# Patient Record
Sex: Male | Born: 1995 | Hispanic: Yes | Marital: Single
Health system: Southern US, Community
[De-identification: ages and names within clinical notes are randomized; demographics above are authoritative.]

---

## 2021-03-23 ENCOUNTER — Other Ambulatory Visit: Payer: Self-pay

## 2021-03-23 ENCOUNTER — Encounter (HOSPITAL_COMMUNITY): Payer: Self-pay

## 2021-03-23 ENCOUNTER — Emergency Department (HOSPITAL_COMMUNITY)
Admission: EM | Admit: 2021-03-23 | Discharge: 2021-03-24 | Disposition: A | Discharge status: Home / Self Care | Payer: Self-pay | Attending: Emergency Medicine | Admitting: Emergency Medicine

## 2021-03-23 ENCOUNTER — Emergency Department (HOSPITAL_COMMUNITY): Payer: Self-pay

## 2021-03-23 DIAGNOSIS — K529 Noninfective gastroenteritis and colitis, unspecified: Secondary | ICD-10-CM | POA: Insufficient documentation

## 2021-03-23 DIAGNOSIS — M545 Low back pain, unspecified: Secondary | ICD-10-CM | POA: Insufficient documentation

## 2021-03-23 LAB — COMPREHENSIVE METABOLIC PANEL
ALT: 21 U/L (ref 0–44)
AST: 18 U/L (ref 15–41)
Albumin: 4.8 g/dL (ref 3.5–5.0)
Alkaline Phosphatase: 69 U/L (ref 38–126)
Anion gap: 10 (ref 5–15)
BUN: 9 mg/dL (ref 6–20)
CO2: 24 mmol/L (ref 22–32)
Calcium: 9.6 mg/dL (ref 8.9–10.3)
Chloride: 103 mmol/L (ref 98–111)
Creatinine, Ser: 0.84 mg/dL (ref 0.61–1.24)
GFR, Estimated: >60 mL/min (ref >60)
Glucose, Bld: 132 mg/dL — ABNORMAL HIGH (ref 70–99)
Potassium: 4 mmol/L (ref 3.5–5.1)
Sodium: 137 mmol/L (ref 135–145)
Total Bilirubin: 0.6 mg/dL (ref 0.3–1.2)
Total Protein: 7.6 g/dL (ref 6.5–8.1)

## 2021-03-23 LAB — CBC
HCT: 49.3 % (ref 39.0–52.0)
Hemoglobin: 16.8 g/dL (ref 13.0–17.0)
MCH: 29 pg (ref 26.0–34.0)
MCHC: 34.1 g/dL (ref 30.0–36.0)
MCV: 85 fL (ref 80.0–100.0)
Platelets: 214 10*3/uL (ref 150–400)
RBC: 5.8 MIL/uL (ref 4.22–5.81)
RDW: 12.3 % (ref 11.5–15.5)
WBC: 7.2 10*3/uL (ref 4.0–10.5)
nRBC: 0 % (ref 0.0–0.2)

## 2021-03-23 LAB — URINALYSIS, ROUTINE W REFLEX MICROSCOPIC
Bilirubin Urine: NEGATIVE
Glucose, UA: NEGATIVE mg/dL
Hgb urine dipstick: NEGATIVE
Ketones, ur: NEGATIVE mg/dL
Leukocytes,Ua: NEGATIVE
Nitrite: NEGATIVE
Protein, ur: NEGATIVE mg/dL
Specific Gravity, Urine: 1.023 (ref 1.005–1.030)
pH: 5 (ref 5.0–8.0)

## 2021-03-23 LAB — LIPASE, BLOOD: Lipase: 24 U/L (ref 11–51)

## 2021-03-23 MED ORDER — FAMOTIDINE 20 MG PO TABS: 20.0000 mg | ORAL_TABLET | Freq: Two times a day (BID) | ORAL | 0 refills | Status: AC

## 2021-03-23 MED ORDER — ONDANSETRON HCL 4 MG/2ML IJ SOLN
4.0000 mg | Freq: Once | INTRAMUSCULAR | Status: AC
  Administered 2021-03-23: 4 mg via INTRAVENOUS
  Filled 2021-03-23: qty 2

## 2021-03-23 MED ORDER — MORPHINE SULFATE (PF) 4 MG/ML IV SOLN
4.0000 mg | Freq: Once | INTRAVENOUS | Status: AC
  Administered 2021-03-23: 4 mg via INTRAVENOUS
  Filled 2021-03-23: qty 1

## 2021-03-23 MED ORDER — SODIUM CHLORIDE 0.9 % IV BOLUS
1000.0000 mL | Freq: Once | INTRAVENOUS | Status: AC
  Administered 2021-03-23: 1000 mL via INTRAVENOUS

## 2021-03-23 MED ORDER — IOHEXOL 350 MG/ML SOLN
80.0000 mL | Freq: Once | INTRAVENOUS | Status: AC | PRN
  Administered 2021-03-23: 80 mL via INTRAVENOUS

## 2021-03-23 MED ORDER — SODIUM CHLORIDE 0.9 % IV SOLN: INTRAVENOUS | Status: DC

## 2021-03-23 MED ORDER — ONDANSETRON HCL 4 MG PO TABS: 4.0000 mg | ORAL_TABLET | Freq: Four times a day (QID) | ORAL | 0 refills | Status: AC

## 2021-03-23 NOTE — ED Provider Notes (Signed)
[facility]-EMERGENCY DEPT Provider Note   CSN: [id number] Arrival date & time: 03/23/21  2035     History Chief Complaint  Patient presents with   Abdominal Pain   Nausea   Emesis    Darrell Osborne is a 25 y.o. male with no past medical history presenting today with a complaint of abdominal pain and vomiting that began this morning at 1 AM. Patient was evaluated with interpreter.  He states that this began suddenly but he attempted to wait it out. However he continued to vomit.  No blood noted.  Pain does not seem to be worsened by eating.  Reports history of an appendectomy 12 years ago.  He has had no diarrhea or constipation or urinary symptoms.  Mild lower back pain present prior to this illness.    History reviewed. No pertinent past medical history.  There are no problems to display for this patient.   History reviewed. No pertinent surgical history.     History reviewed. No pertinent family history.     Home Medications Prior to Admission medications   Not on File    Allergies    Patient has no known allergies.  Review of Systems   Review of Systems  Constitutional:  Negative for chills and fever.  Respiratory:  Negative for shortness of breath.   Cardiovascular:  Negative for chest pain and palpitations.  Gastrointestinal:  Positive for abdominal pain, nausea and vomiting. Negative for constipation and diarrhea.  Genitourinary:  Negative for difficulty urinating, dysuria and frequency.  Musculoskeletal:  Positive for back pain.  Neurological:  Positive for dizziness. Negative for weakness, light-headedness and headaches.  All other systems reviewed and are negative.  Physical Exam Updated Vital Signs BP (!) 157/90   Pulse 60   Resp 20   SpO2 97%   Physical Exam Vitals and nursing note reviewed.  Constitutional:      General: He is not in acute distress.    Appearance: Normal appearance. He is well-developed.   HENT:     Head: Normocephalic and atraumatic.     Mouth/Throat:     Mouth: Mucous membranes are moist.     Pharynx: Oropharynx is clear.  Eyes:     General: No scleral icterus.    Conjunctiva/sclera: Conjunctivae normal.  Cardiovascular:     Rate and Rhythm: Normal rate and regular rhythm.  Pulmonary:     Effort: Pulmonary effort is normal. No respiratory distress.  Abdominal:     General: Abdomen is flat. Bowel sounds are normal. There is no distension.     Palpations: Abdomen is soft.     Tenderness: There is abdominal tenderness in the epigastric area and periumbilical area. There is no right CVA tenderness or left CVA tenderness.  Skin:    General: Skin is warm and dry.     Findings: No rash.  Neurological:     Mental Status: He is alert.  Psychiatric:        Mood and Affect: Mood normal.    ED Results / Procedures / Treatments   Labs (all labs ordered are listed, but only abnormal results are displayed) Labs Reviewed  COMPREHENSIVE METABOLIC PANEL - Abnormal; Notable for the following components:      Result Value   Glucose, Bld 132 (*)    All other components within normal limits  LIPASE, BLOOD  CBC  URINALYSIS, ROUTINE W REFLEX MICROSCOPIC    EKG None  Radiology CT ABDOMEN PELVIS W CONTRAST  Result  Date: 03/23/2021 CLINICAL DATA:  Epigastric pain with abdominal pain and swelling. EXAM: CT ABDOMEN AND PELVIS WITH CONTRAST TECHNIQUE: Multidetector CT imaging of the abdomen and pelvis was performed using the standard protocol following bolus administration of intravenous contrast. CONTRAST:  3mL OMNIPAQUE IOHEXOL 350 MG/ML SOLN COMPARISON:  None. FINDINGS: Lower chest: No acute abnormality. Hepatobiliary: No focal liver abnormality is seen. No gallstones, gallbladder wall thickening, or biliary dilatation. Pancreas: Unremarkable. No pancreatic ductal dilatation or surrounding inflammatory changes. Spleen: Normal in size without focal abnormality. Adrenals/Urinary  Tract: Adrenal glands are unremarkable. Kidneys are normal, without renal calculi, focal lesion, or hydronephrosis. Bladder is unremarkable. Stomach/Bowel: Stomach is within normal limits. The appendix is poorly visualized and may be retrocecal in location. No secondary signs of appendicitis are identified. No evidence of bowel wall thickening, distention, or inflammatory changes. Vascular/Lymphatic: No significant vascular findings are present. No enlarged abdominal or pelvic lymph nodes. Reproductive: Prostate is unremarkable. Other: No abdominal wall hernia or abnormality. No abdominopelvic ascites. Musculoskeletal: No acute or significant osseous findings. IMPRESSION: No evidence of acute or active process within the abdomen or pelvis. Electronically Signed   By: Aram Candela M.D.   On: 03/23/2021 22:12    Procedures Procedures   Medications Ordered in ED Medications  sodium chloride 0.9 % bolus 1,000 mL (1,000 mLs Intravenous New Bag/Given 03/23/21 2149)    And  0.9 %  sodium chloride infusion (has no administration in time range)  morphine 4 MG/ML injection 4 mg (has no administration in time range)  ondansetron (ZOFRAN) injection 4 mg (4 mg Intravenous Given 03/23/21 2146)  morphine 4 MG/ML injection 4 mg (4 mg Intravenous Given 03/23/21 2146)  iohexol (OMNIPAQUE) 350 MG/ML injection 80 mL (80 mLs Intravenous Contrast Given 03/23/21 2156)    ED Course  I have reviewed the triage vital signs and the nursing notes.  Pertinent labs & imaging results that were available during my care of the patient were reviewed by me and considered in my medical decision making (see chart for details).  Patient was evaluated by me via the interpreter at bedside. HR 90s.   MDM Rules/Calculators/A&P                         Patient presented with acute abdominal pain that began at 1 AM this morning.  He vomited many times and was concerned that he might have an ulcer.  I ordered standard abdominal pain  labs that were unremarkable. CT scan also revealed no abnormalities.  Upon reevaluation patient was in NAD and said morphine helped. Still slightly tender to the epigastrium. He reports feeling better and he and mother comforted by the benign work up. Mother at bedside without questions. I gave the patient ice water and re-evaluated him. Says the water did not make him nauseous or increase his pain.  Patient remains HD stable and patient and mother are agreeable to discharge. Zofran and Pepcid sent to the pharmacy. Patient given printed information in Spanish.  Final Clinical Impression(s) / ED Diagnoses Final diagnoses:  Gastroenteritis    Rx / DC Orders Results and diagnoses were explained to the patient. Return precautions discussed in full. Patient had no additional questions and expressed complete understanding.   Saddie Benders, PA-C 03/23/21 2347    Sloan Leiter, DO 03/24/21 0007

## 2021-03-23 NOTE — ED Triage Notes (Signed)
Pt c/o abdominal pain starting at 0100 today. Pt c/o N/V all day today. Pt states his last BM today.

## 2022-10-27 IMAGING — CT CT ABD-PELV W/ CM
2 of 4 series · 16 of 46 positions shown, 18 images · IV contrast (omnipaque)
Comparison: None.

CLINICAL DATA: Epigastric pain with abdominal pain and swelling.

EXAM:
CT ABDOMEN AND PELVIS WITH CONTRAST
TECHNIQUE: Multidetector CT imaging of the abdomen and pelvis was performed
using the standard protocol following bolus administration of
intravenous contrast.
CONTRAST:  80mL OMNIPAQUE IOHEXOL 350 MG/ML SOLN

[Series 2: axial st · axial · 0.84mm/px · z∈[+833,+1308]mm · 13 of 109 slices shown, 15 images]
[im 7/109  soft-tissue]
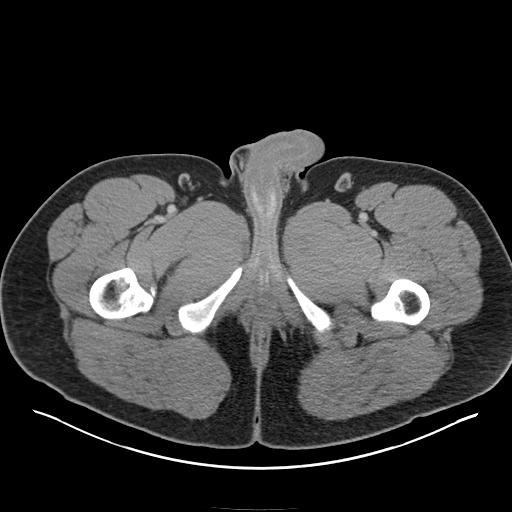
[im 7/109  bone]
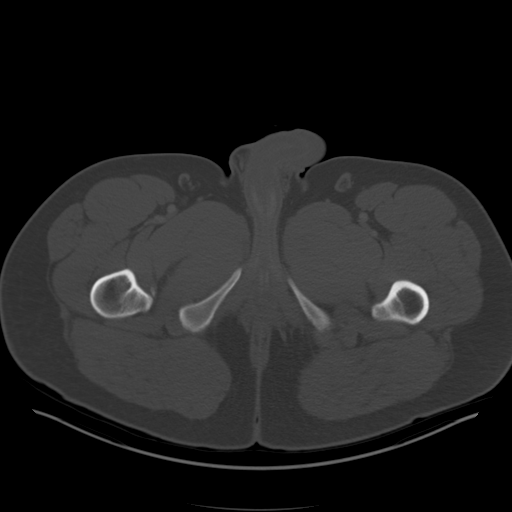
[im 14/109  soft-tissue]
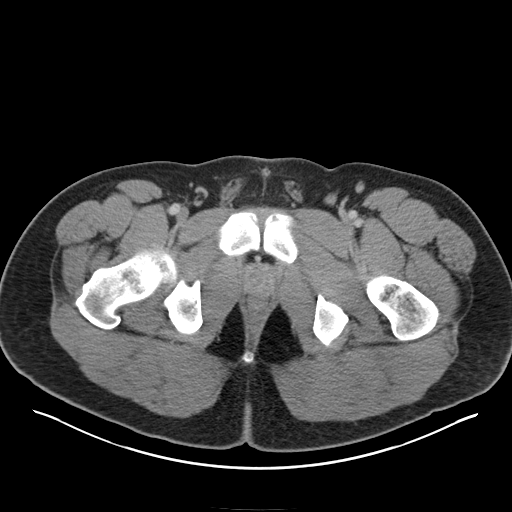
[im 21/109  soft-tissue]
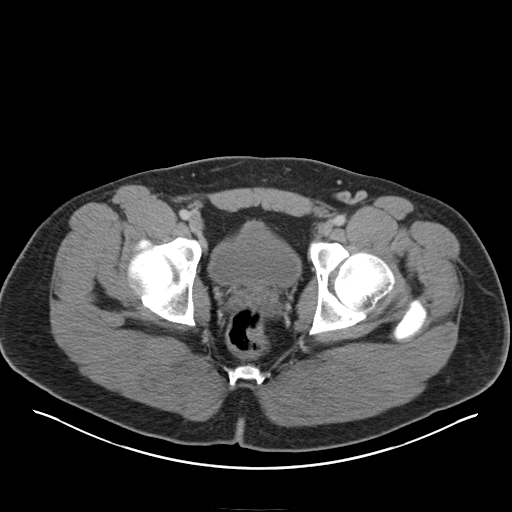
[im 34/109  soft-tissue]
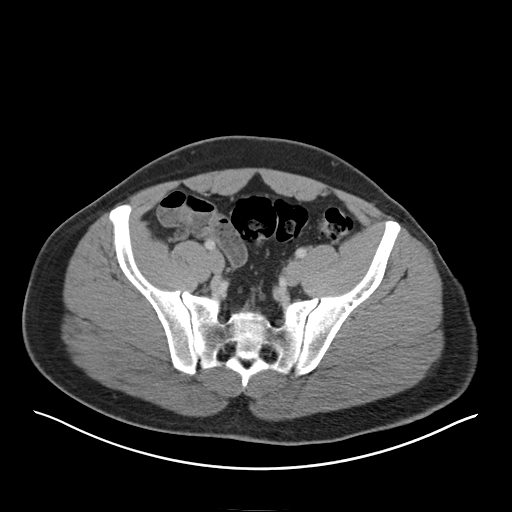
[im 41/109  soft-tissue]
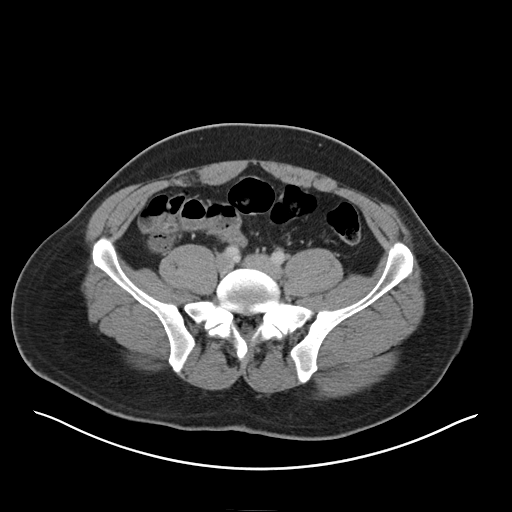
[im 48/109  soft-tissue]
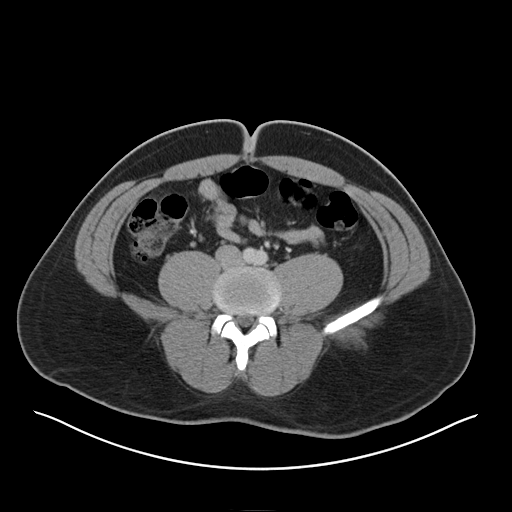
[im 55/109  soft-tissue]
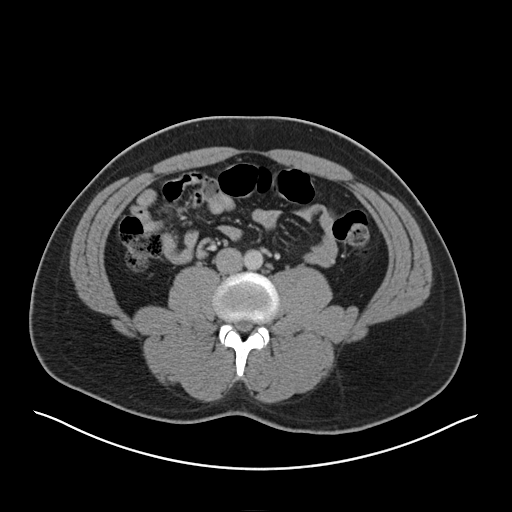
[im 61/109  soft-tissue]
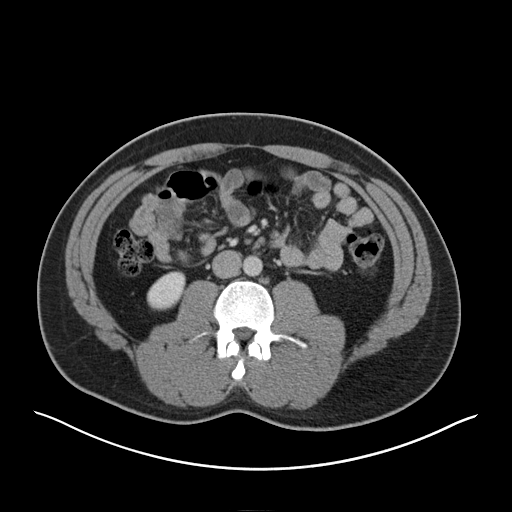
[im 68/109  soft-tissue]
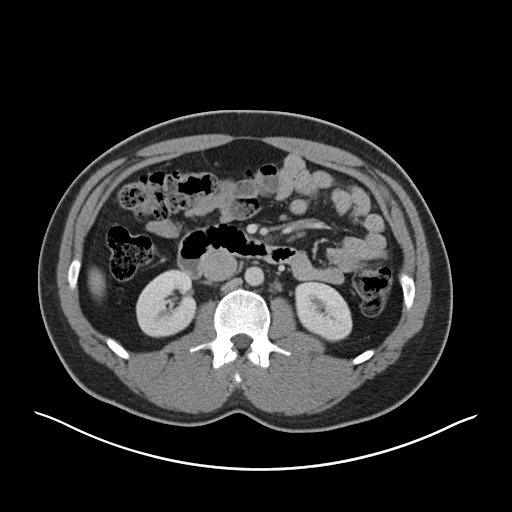
[im 68/109  bone]
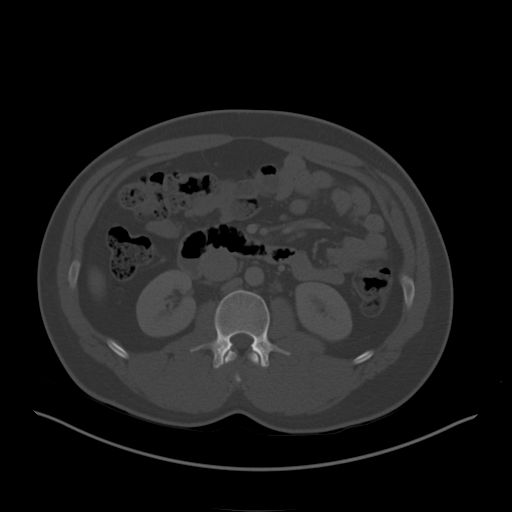
[im 75/109  soft-tissue]
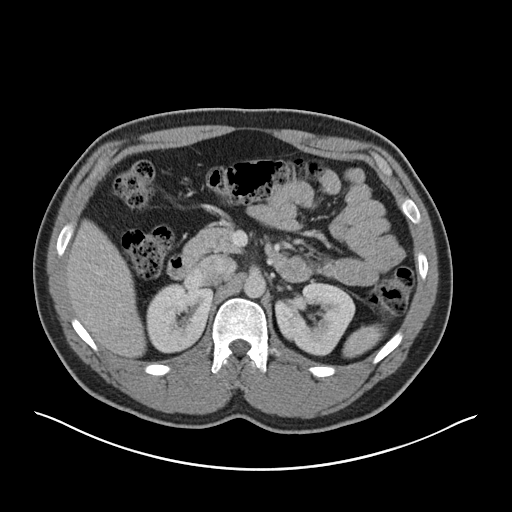
[im 88/109  soft-tissue]
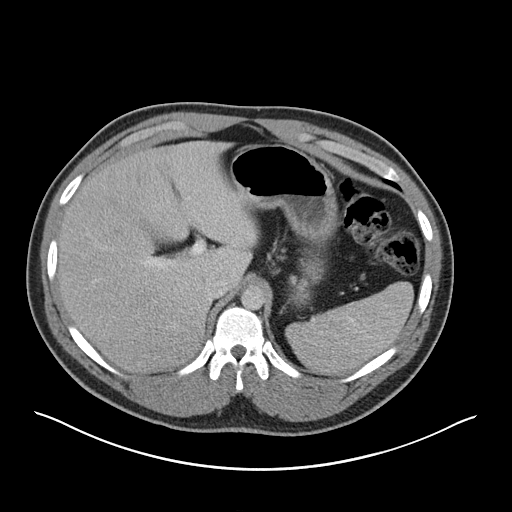
[im 95/109  soft-tissue]
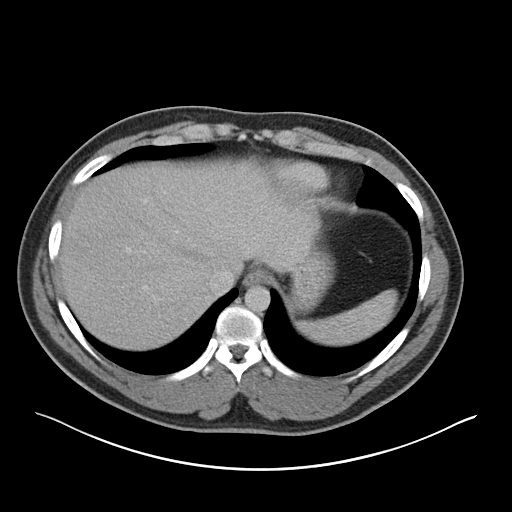
[im 102/109  soft-tissue]
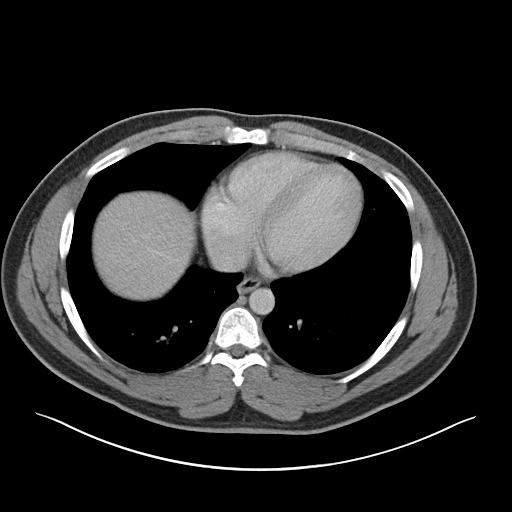

[Series 4: coronal st · coronal · 0.92mm/px · 3 of 129 slices shown]
[im 43/129  soft-tissue]
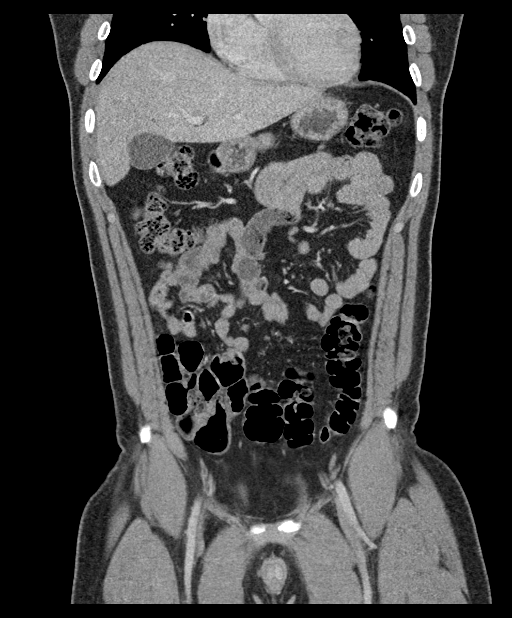
[im 57/129  soft-tissue]
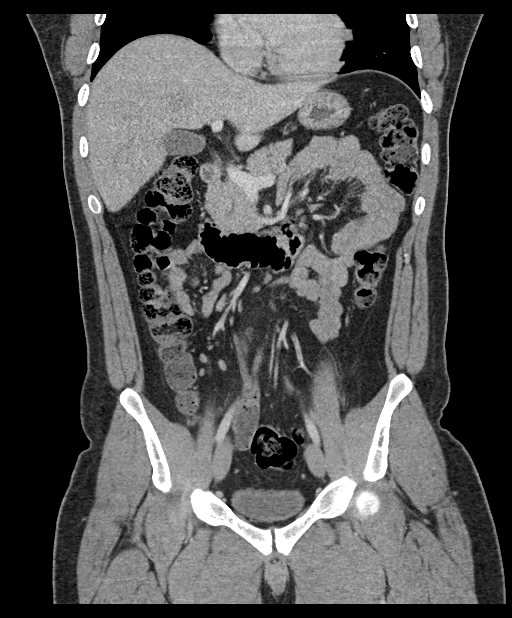
[im 72/129  soft-tissue]
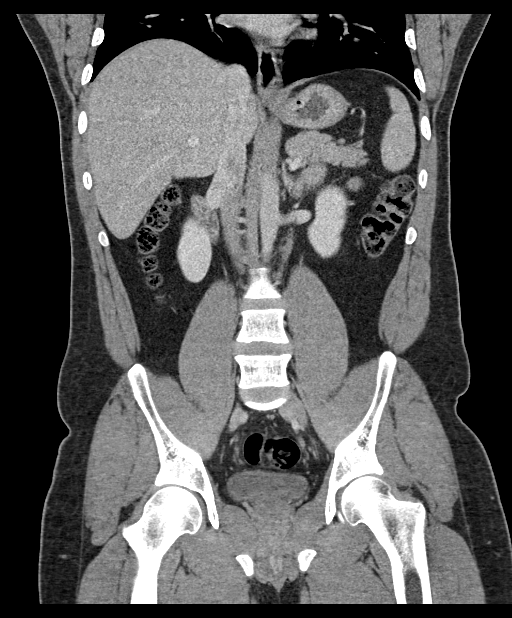

[16 of 46 positions shown; findings below may reference images not displayed]

FINDINGS: Lower chest: No acute abnormality.

Hepatobiliary: No focal liver abnormality is seen. No gallstones,
gallbladder wall thickening, or biliary dilatation.

Pancreas: Unremarkable. No pancreatic ductal dilatation or
surrounding inflammatory changes.

Spleen: Normal in size without focal abnormality.

Adrenals/Urinary Tract: Adrenal glands are unremarkable. Kidneys are
normal, without renal calculi, focal lesion, or hydronephrosis.
Bladder is unremarkable.

Stomach/Bowel: Stomach is within normal limits. The appendix is
poorly visualized and may be retrocecal in location. No secondary
signs of appendicitis are identified. No evidence of bowel wall
thickening, distention, or inflammatory changes.

Vascular/Lymphatic: No significant vascular findings are present. No
enlarged abdominal or pelvic lymph nodes.

Reproductive: Prostate is unremarkable.

Other: No abdominal wall hernia or abnormality. No abdominopelvic
ascites.

Musculoskeletal: No acute or significant osseous findings.
IMPRESSION: No evidence of acute or active process within the abdomen or pelvis.
